# Patient Record
Sex: Male | Born: 2000 | Hispanic: No | Marital: Single | Smoking: Never smoker
Health system: Southern US, Community
[De-identification: ages and names within clinical notes are randomized; demographics above are authoritative.]

## PROBLEM LIST (undated history)

## (undated) DIAGNOSIS — F200 Paranoid schizophrenia: Secondary | ICD-10-CM

## (undated) DIAGNOSIS — F909 Attention-deficit hyperactivity disorder, unspecified type: Secondary | ICD-10-CM

## (undated) DIAGNOSIS — F23 Brief psychotic disorder: Secondary | ICD-10-CM

---
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
## 2020-09-11 IMAGING — DX DG WRIST COMPLETE 3+V*R*
4 series · 4 of 4 positions shown · non-contrast
Comparison: None.

CLINICAL DATA: Right wrist pain and swelling.

EXAM:
RIGHT WRIST - COMPLETE 3+ VIEW

[wrist ap (1 of 2)]
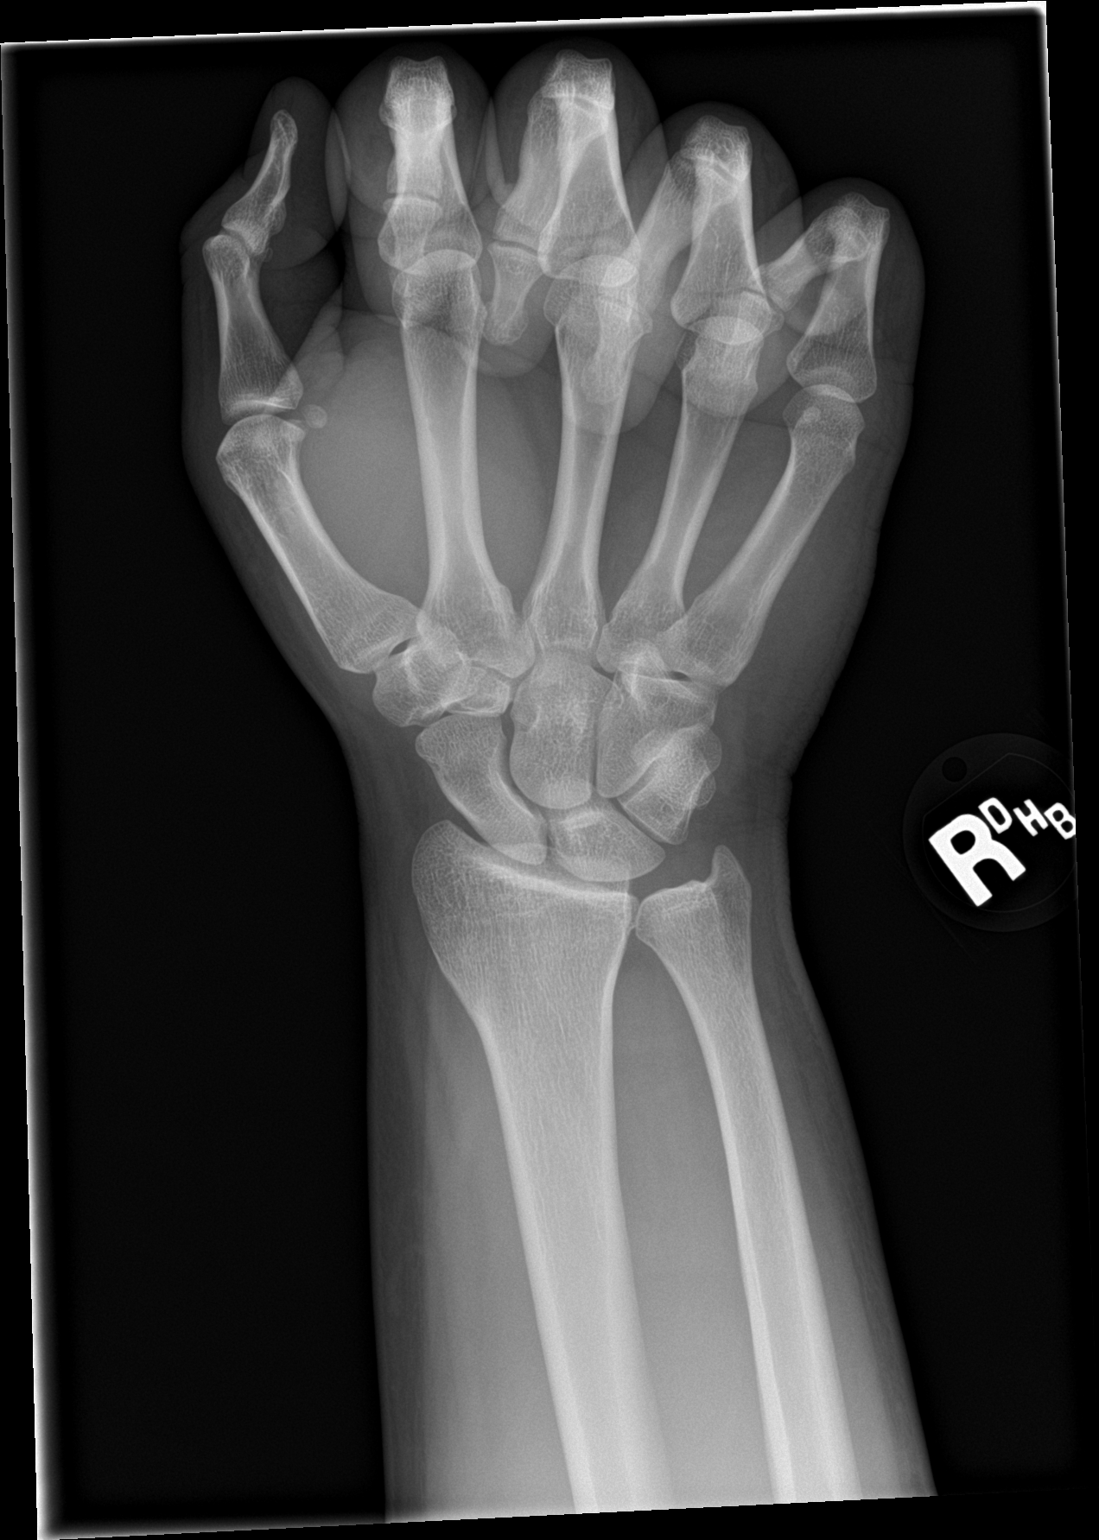

[wrist obl]
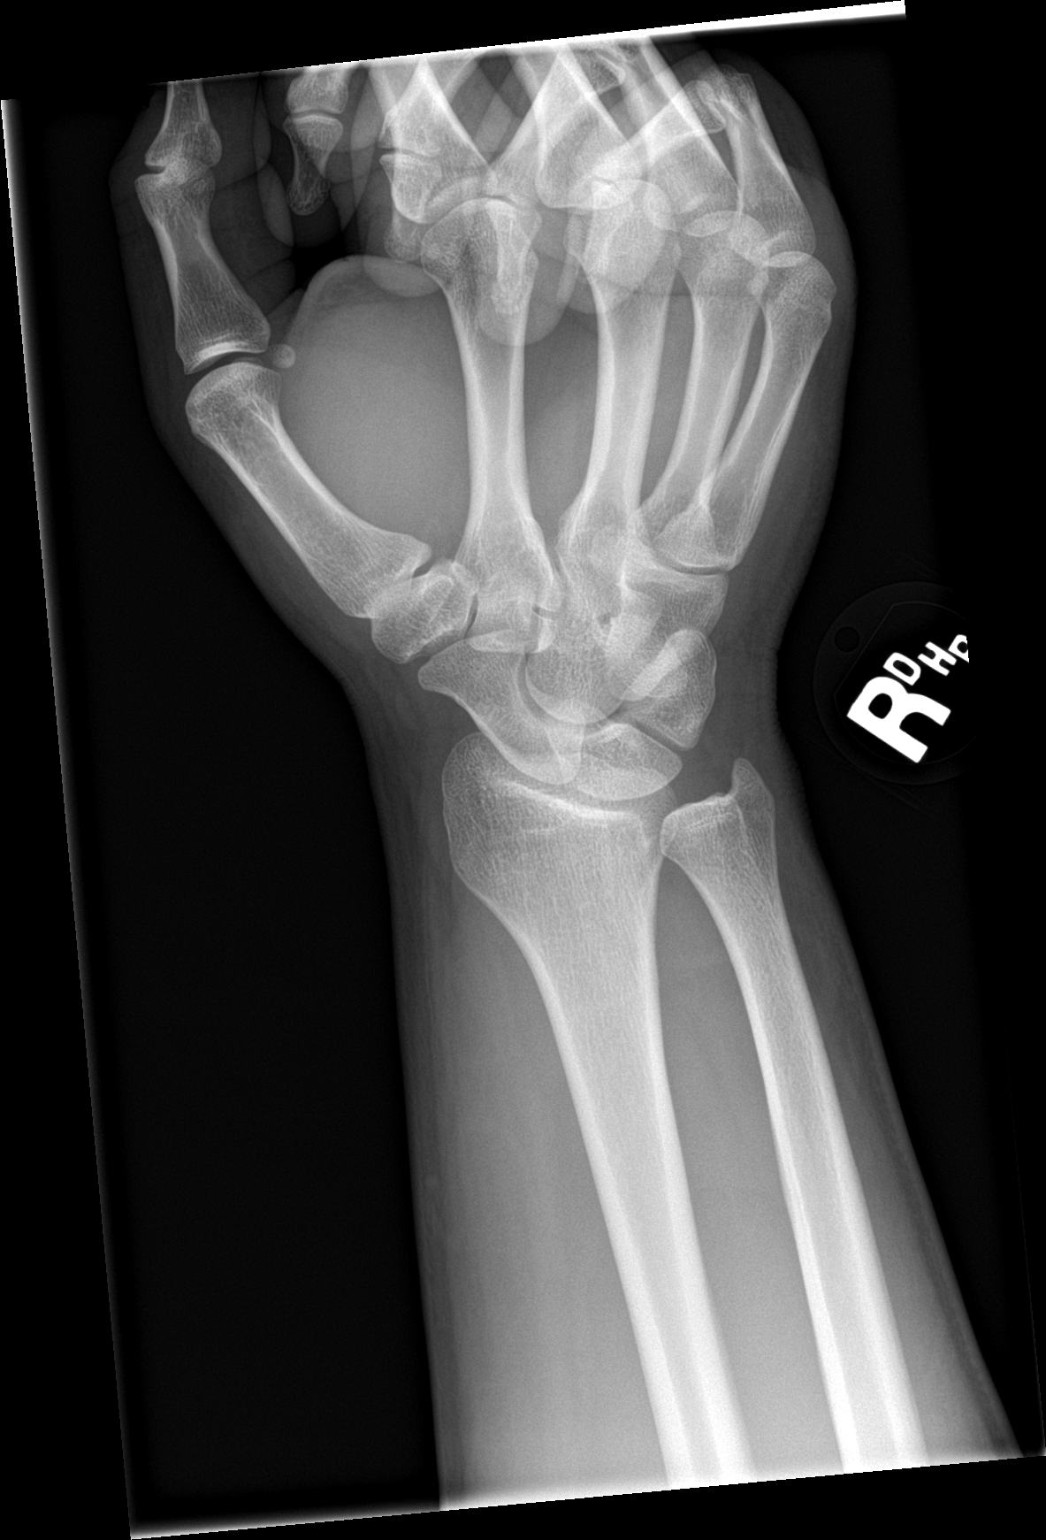

[wrist lat]
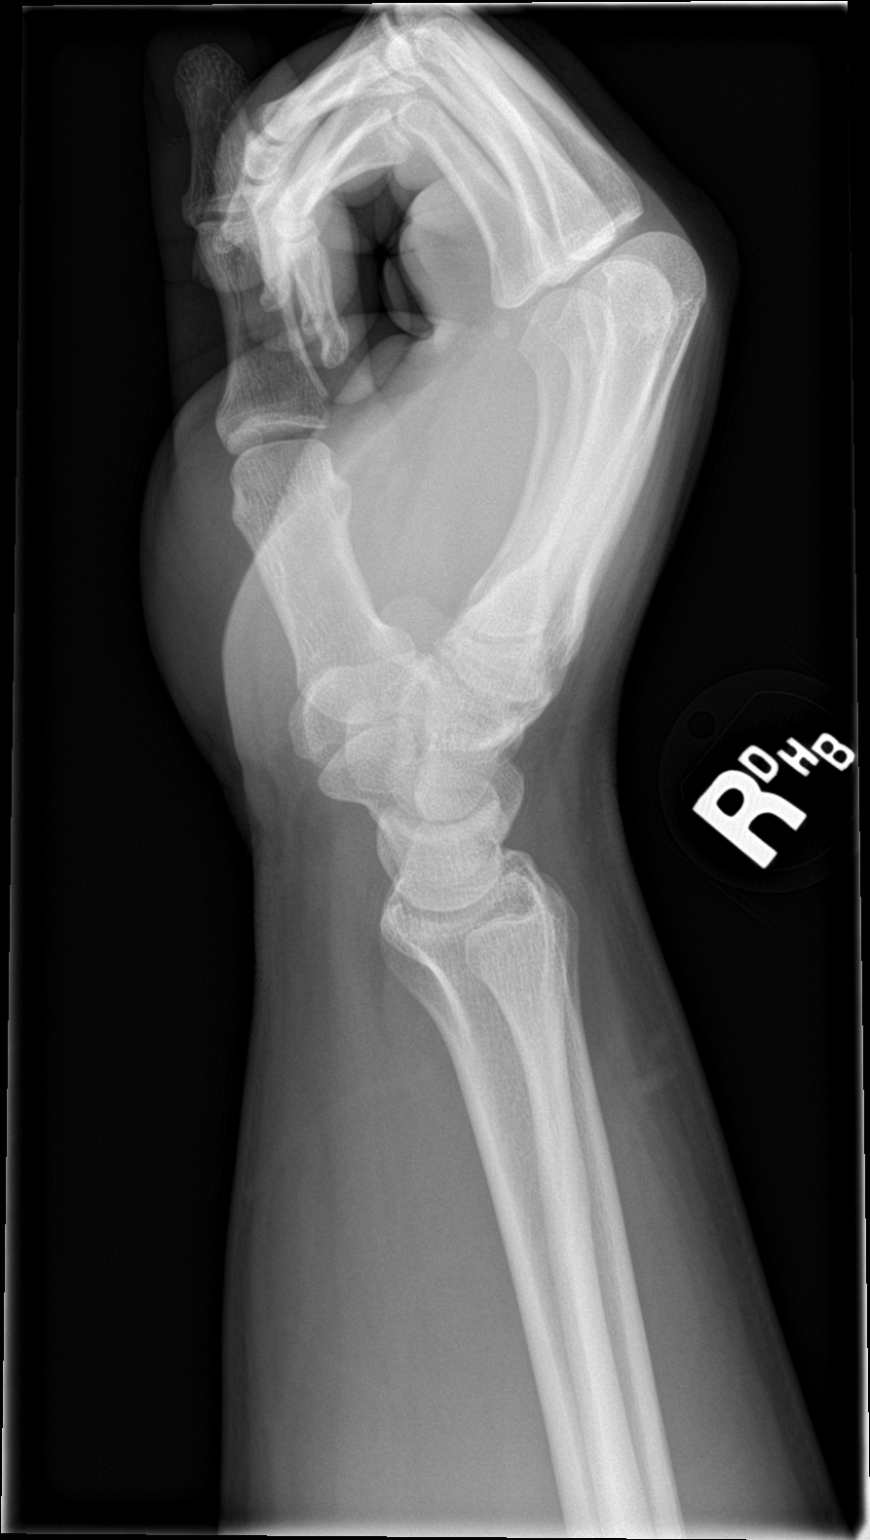

[wrist ap (2 of 2)]
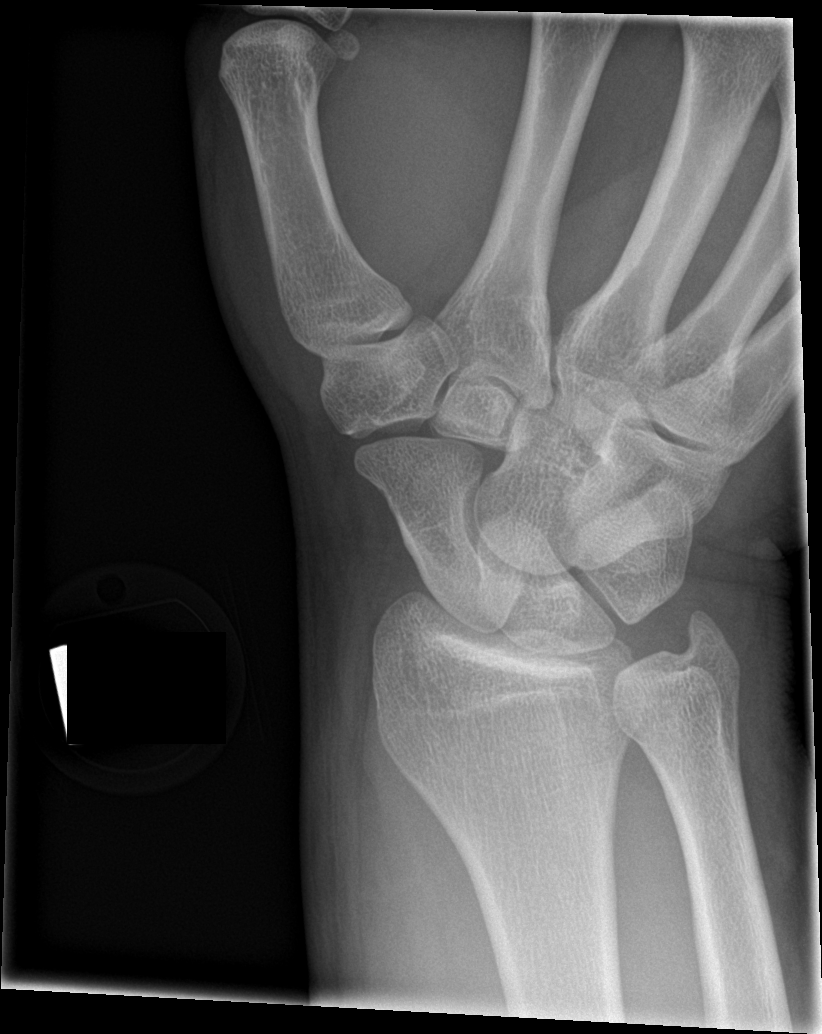

[4 of 4 positions shown; findings below may reference images not displayed]

FINDINGS: There is no evidence of fracture or dislocation. There is no
evidence of arthropathy or other focal bone abnormality. Soft
tissues are unremarkable.
IMPRESSION: Negative.
